# Patient Record
Sex: Male | Born: 1980 | Hispanic: Refuse to answer | Marital: Married | Smoking: Never smoker
Health system: Southern US, Community
[De-identification: ages and names within clinical notes are randomized; demographics above are authoritative.]

---

## 2016-07-20 ENCOUNTER — Other Ambulatory Visit: Payer: Self-pay

## 2016-07-20 ENCOUNTER — Ambulatory Visit (INDEPENDENT_AMBULATORY_CARE_PROVIDER_SITE_OTHER): Payer: BLUE CROSS/BLUE SHIELD

## 2016-07-20 ENCOUNTER — Ambulatory Visit
Admission: EM | Admit: 2016-07-20 | Discharge: 2016-07-20 | Disposition: A | Discharge status: Home / Self Care | Payer: BLUE CROSS/BLUE SHIELD | Attending: Family Medicine | Admitting: Family Medicine

## 2016-07-20 DIAGNOSIS — R509 Fever, unspecified: Secondary | ICD-10-CM | POA: Insufficient documentation

## 2016-07-20 DIAGNOSIS — R079 Chest pain, unspecified: Secondary | ICD-10-CM | POA: Insufficient documentation

## 2016-07-20 DIAGNOSIS — M545 Low back pain: Secondary | ICD-10-CM | POA: Insufficient documentation

## 2016-07-20 DIAGNOSIS — R05 Cough: Secondary | ICD-10-CM | POA: Diagnosis present

## 2016-07-20 DIAGNOSIS — J181 Lobar pneumonia, unspecified organism: Secondary | ICD-10-CM

## 2016-07-20 DIAGNOSIS — J189 Pneumonia, unspecified organism: Secondary | ICD-10-CM | POA: Insufficient documentation

## 2016-07-20 MED ORDER — LEVOFLOXACIN 500 MG PO TABS: 500.0000 mg | ORAL_TABLET | Freq: Every day | ORAL | 0 refills | Status: DC

## 2016-07-20 MED ORDER — CEFTRIAXONE SODIUM 1 G IJ SOLR
1.0000 g | Freq: Once | INTRAMUSCULAR | Status: AC
  Administered 2016-07-20: 1 g via INTRAMUSCULAR

## 2016-07-20 NOTE — ED Triage Notes (Signed)
Pt c/o chest and back pain, he has had a bad cough fo rabout 5 days and it keeps getting worse. He feels weak and its hard to take a deep breath and short of breath.

## 2016-07-20 NOTE — ED Provider Notes (Signed)
[facility]    CSN: [id number] Arrival date & time: 07/20/16  1811     History   Chief Complaint Chief Complaint  Patient presents with  . Cough  . Back Pain    HPI Victor Kim is a 36 y.o. male.   The history is provided by the patient.  Cough  Cough characteristics:  Productive Severity:  Moderate Onset quality:  Sudden Duration:  5 days Chronicity:  New Relieved by:  None tried Associated symptoms: chest pain, fever and shortness of breath   Associated symptoms: no headaches, no myalgias, no sinus congestion, no sore throat and no wheezing   Back Pain  Associated symptoms: chest pain and fever   Associated symptoms: no headaches   URI  Presenting symptoms: congestion, cough, fatigue and fever   Presenting symptoms: no sore throat   Severity:  Moderate Onset quality:  Sudden Duration:  5 days Timing:  Constant Progression:  Worsening Chronicity:  New Relieved by:  None tried Ineffective treatments:  None tried Associated symptoms: no headaches, no myalgias, no sinus pain and no wheezing   Risk factors: sick contacts   Risk factors: not elderly, no chronic cardiac disease, no chronic kidney disease, no chronic respiratory disease, no diabetes mellitus, no immunosuppression, no recent illness and no recent travel     History reviewed. No pertinent past medical history.  There are no active problems to display for this patient.   History reviewed. No pertinent surgical history.     Home Medications    Prior to Admission medications   Medication Sig Start Date End Date Taking? Authorizing Provider  levofloxacin (LEVAQUIN) 500 MG tablet Take 1 tablet (500 mg total) by mouth daily. 07/20/16   Payton Mccallum, MD    Family History History reviewed. No pertinent family history.  Social History Social History  Substance Use Topics  . Smoking status: Never Smoker  . Smokeless tobacco: Never Used  . Alcohol use No     Allergies     Patient has no known allergies.   Review of Systems Review of Systems  Constitutional: Positive for fatigue and fever.  HENT: Positive for congestion. Negative for sinus pain and sore throat.   Respiratory: Positive for cough and shortness of breath. Negative for wheezing.   Cardiovascular: Positive for chest pain.  Musculoskeletal: Positive for back pain. Negative for myalgias.  Neurological: Negative for headaches.     Physical Exam Triage Vital Signs ED Triage Vitals  Enc Vitals Group     BP 07/20/16 1826 129/81     Pulse Rate 07/20/16 1826 85     Resp 07/20/16 1826 18     Temp 07/20/16 1826 98.6 F (37 C)     Temp Source 07/20/16 1826 Oral     SpO2 07/20/16 1826 96 %     Weight 07/20/16 1825 208 lb (94.3 kg)     Height 07/20/16 1825 5\' 9"  (1.753 m)     Head Circumference --      Peak Flow --      Pain Score 07/20/16 1826 8     Pain Loc --      Pain Edu? --      Excl. in GC? --    No data found.   Updated Vital Signs BP 129/81 (BP Location: Left Arm)   Pulse 85   Temp 98.6 F (37 C) (Oral)   Resp 18   Ht 5\' 9"  (1.753 m)   Wt 208 lb (94.3 kg)  SpO2 96%   BMI 30.72 kg/m   Visual Acuity Right Eye Distance:   Left Eye Distance:   Bilateral Distance:    Right Eye Near:   Left Eye Near:    Bilateral Near:     Physical Exam  Constitutional: He appears well-developed and well-nourished. No distress.  HENT:  Head: Normocephalic and atraumatic.  Right Ear: Tympanic membrane, external ear and ear canal normal.  Left Ear: Tympanic membrane, external ear and ear canal normal.  Nose: Nose normal.  Mouth/Throat: Uvula is midline, oropharynx is clear and moist and mucous membranes are normal. No oropharyngeal exudate or tonsillar abscesses.  Eyes: Conjunctivae and EOM are normal. Pupils are equal, round, and reactive to light. Right eye exhibits no discharge. Left eye exhibits no discharge. No scleral icterus.  Neck: Normal range of motion. Neck supple. No  tracheal deviation present. No thyromegaly present.  Cardiovascular: Normal rate, regular rhythm and normal heart sounds.   Pulmonary/Chest: Effort normal. No stridor. No respiratory distress. He has no wheezes. He has rales (left). He exhibits no tenderness.  Lymphadenopathy:    He has no cervical adenopathy.  Neurological: He is alert.  Skin: Skin is warm and dry. No rash noted. He is not diaphoretic.  Nursing note and vitals reviewed.    UC Treatments / Results  Labs (all labs ordered are listed, but only abnormal results are displayed) Labs Reviewed - No data to display  EKG  EKG Interpretation None       Radiology Dg Chest 2 View  Result Date: 07/20/2016 CLINICAL DATA:  Productive cough and chest pain for 3 days EXAM: CHEST  2 VIEW COMPARISON:  None. FINDINGS: Partial consolidation is present in the left lower lobe. Heart size normal. No effusion or pneumothorax. IMPRESSION: Left lower lobe partial consolidation suspicious for pneumonia Electronically Signed   By: Jasmine Pang M.D.   On: 07/20/2016 19:55    Procedures ED EKG Date/Time: 07/20/2016 9:16 PM Performed by: Payton Mccallum Authorized by: Payton Mccallum   ECG reviewed by ED Physician in the absence of a cardiologist: yes   Previous ECG:    Previous ECG:  Unavailable Interpretation:    Interpretation: normal   Rate:    ECG rate assessment: normal   Rhythm:    Rhythm: sinus rhythm   Ectopy:    Ectopy: none   QRS:    QRS axis:  Normal Conduction:    Conduction: normal   ST segments:    ST segments:  Normal T waves:    T waves: normal     (including critical care time)  Medications Ordered in UC Medications  cefTRIAXone (ROCEPHIN) injection 1 g (1 g Intramuscular Given 07/20/16 2008)     Initial Impression / Assessment and Plan / UC Course  I have reviewed the triage vital signs and the nursing notes.  Pertinent labs & imaging results that were available during my care of the patient were  reviewed by me and considered in my medical decision making (see chart for details).  Clinical Course       Final Clinical Impressions(s) / UC Diagnoses   Final diagnoses:  Pneumonia of left lower lobe due to infectious organism [facility])    New Prescriptions Discharge Medication List as of 07/20/2016  8:10 PM    START taking these medications   Details  levofloxacin (LEVAQUIN) 500 MG tablet Take 1 tablet (500 mg total) by mouth daily., Starting Tue 07/20/2016, Normal       1. ekg/x-ray  results and diagnosis reviewed with patient 2. Rocephin 1gm im x 1 3. rx as per orders above; reviewed possible side effects, interactions, risks and benefits  4. Recommend supportive treatment with rest, fluids 5. Follow-up prn if symptoms worsen or don't improve   Payton Mccallumrlando Cheo Selvey, MD 07/20/16 2118

## 2016-07-20 NOTE — ED Notes (Signed)
Patient shows no signs of adverse reaction to medication at this time.  

## 2018-07-30 IMAGING — CR DG CHEST 2V
2 series · 2 of 2 positions shown · non-contrast
Comparison: None.

CLINICAL DATA: Productive cough and chest pain for 3 days

EXAM:
CHEST  2 VIEW

[chest pa]
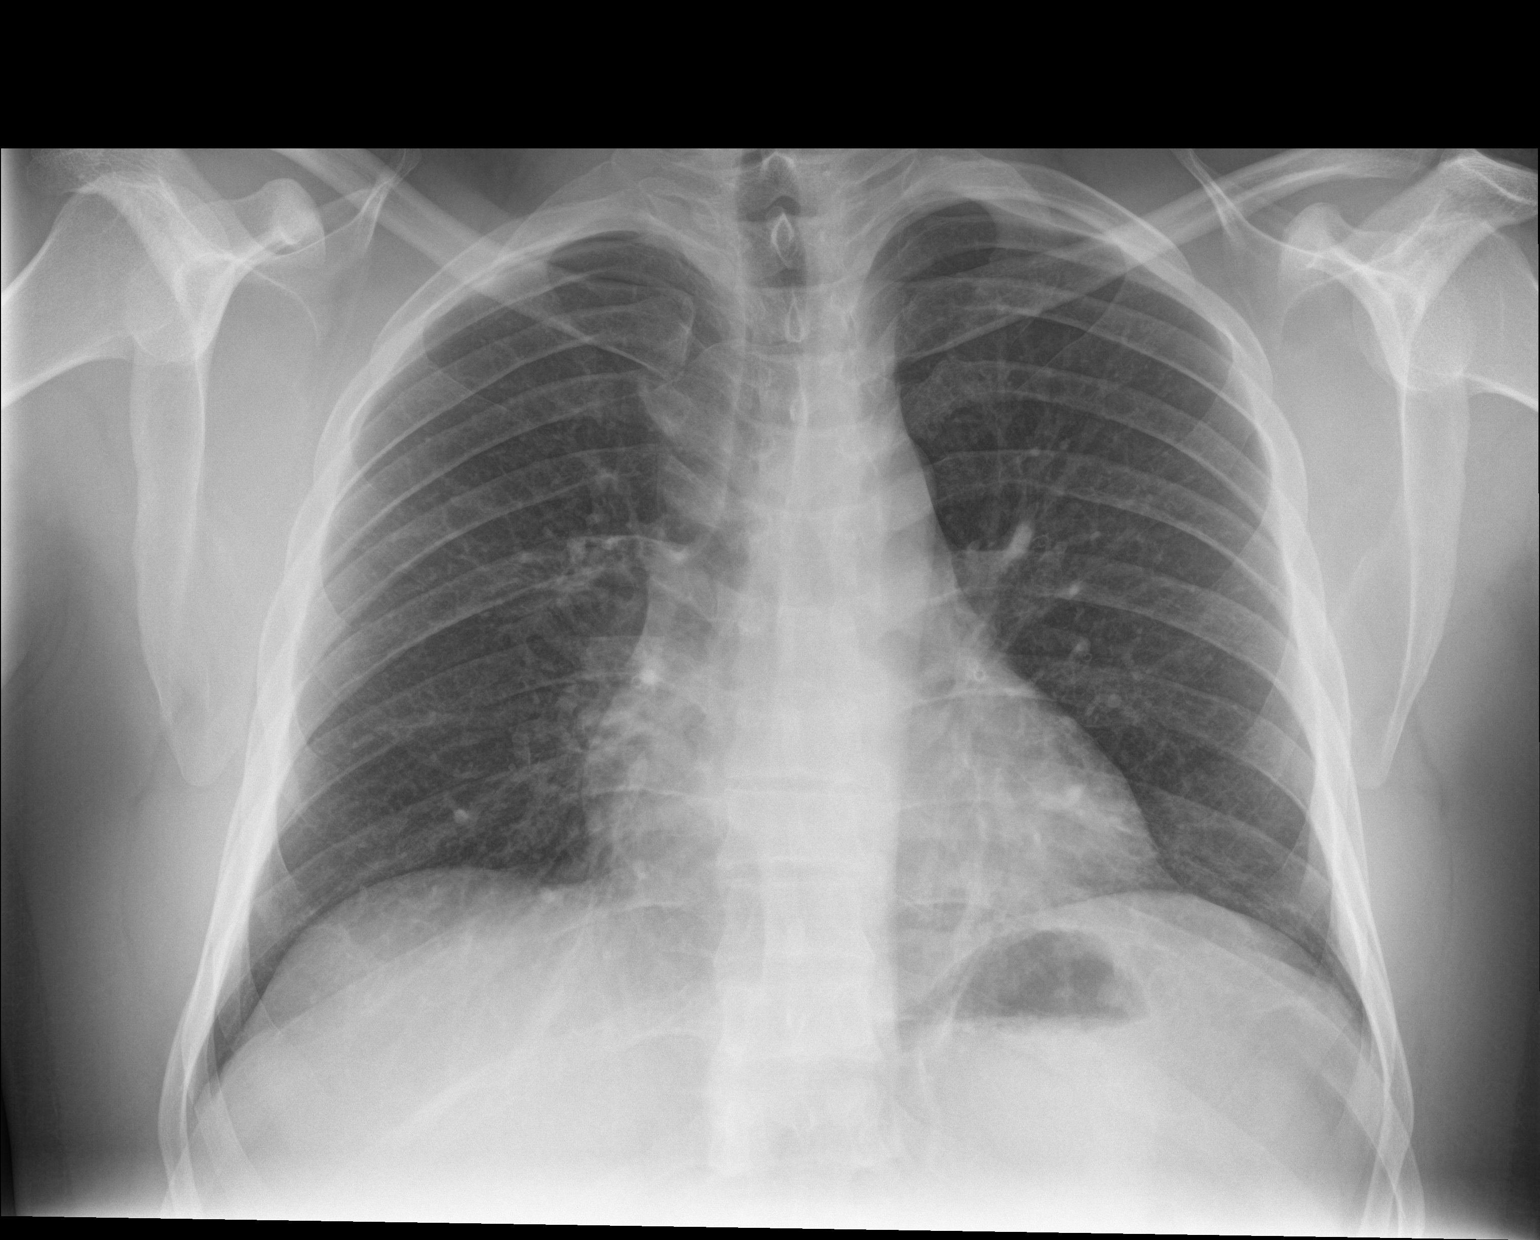

[chest lat]
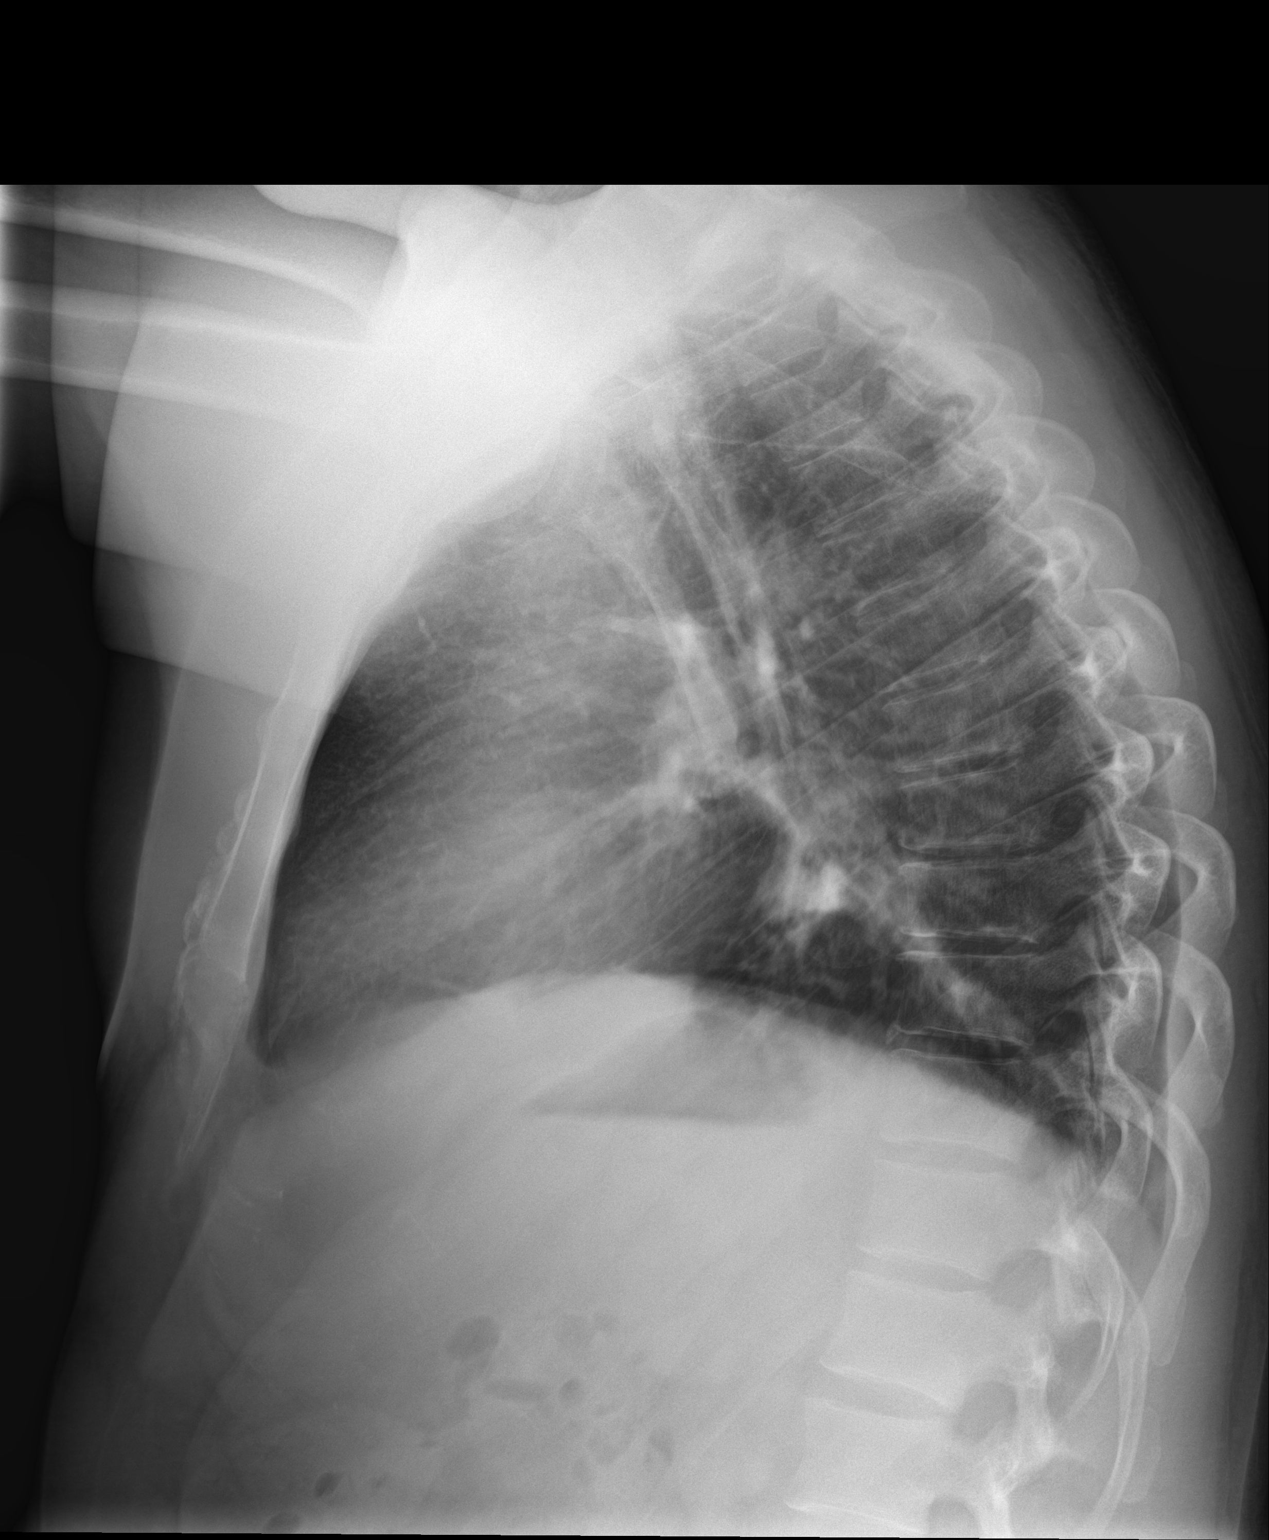

[2 of 2 positions shown; findings below may reference images not displayed]

FINDINGS: Partial consolidation is present in the left lower lobe. Heart size
normal. No effusion or pneumothorax.
IMPRESSION: Left lower lobe partial consolidation suspicious for pneumonia

## 2023-03-14 ENCOUNTER — Ambulatory Visit
Admission: EM | Admit: 2023-03-14 | Discharge: 2023-03-14 | Disposition: A | Discharge status: Home / Self Care | Payer: Self-pay | Attending: Emergency Medicine | Admitting: Emergency Medicine

## 2023-03-14 DIAGNOSIS — L247 Irritant contact dermatitis due to plants, except food: Secondary | ICD-10-CM

## 2023-03-14 MED ORDER — PREDNISONE 10 MG (21) PO TBPK: ORAL_TABLET | Freq: Every day | ORAL | 0 refills | Status: DC

## 2023-03-14 MED ORDER — METHYLPREDNISOLONE ACETATE 80 MG/ML IJ SUSP
80.0000 mg | Freq: Once | INTRAMUSCULAR | Status: AC
  Administered 2023-03-14: 80 mg via INTRAMUSCULAR

## 2023-03-14 NOTE — ED Provider Notes (Signed)
[facility]    CSN: [id number] Arrival date & time: 03/14/23  1550      History   Chief Complaint Chief Complaint  Patient presents with   Poison Ivy    HPI Victor Kim is a 42 y.o. male.   HPI  42 year old male with no significant past medical history presents for evaluation of possible poison ivy rash on both arms and his face.  He reports that he got into some poison ivy 2 days ago.  He denies any pain but does endorse itching.  He does have a lesion on the bridge of his nose near the inner canthus of his left eye but he denies any itching to his eye or discharge.  He also denies any difficulty breathing, throat tightness, or swelling of his lips or tongue.  He has been using calamine lotion without any improvement of symptoms.  History reviewed. No pertinent past medical history.  There are no problems to display for this patient.   History reviewed. No pertinent surgical history.     Home Medications    Prior to Admission medications   Medication Sig Start Date End Date Taking? Authorizing Provider  predniSONE (STERAPRED UNI-PAK 21 TAB) 10 MG (21) TBPK tablet Take by mouth daily. Take 6 tabs by mouth daily  for 2 days, then 5 tabs for 2 days, then 4 tabs for 2 days, then 3 tabs for 2 days, 2 tabs for 2 days, then 1 tab by mouth daily for 2 days 03/14/23  Yes Becky Augusta, NP  levofloxacin (LEVAQUIN) 500 MG tablet Take 1 tablet (500 mg total) by mouth daily. 07/20/16   Payton Mccallum, MD    Family History History reviewed. No pertinent family history.  Social History Social History   Tobacco Use   Smoking status: Never   Smokeless tobacco: Never  Substance Use Topics   Alcohol use: No   Drug use: No     Allergies   Patient has no known allergies.   Review of Systems Review of Systems  HENT:  Negative for facial swelling and trouble swallowing.   Eyes:  Negative for discharge, itching and visual disturbance.  Respiratory:   Negative for shortness of breath and stridor.   Skin:  Positive for rash.     Physical Exam Triage Vital Signs ED Triage Vitals [03/14/23 1559]  Encounter Vitals Group     BP      Systolic BP Percentile      Diastolic BP Percentile      Pulse      Resp      Temp      Temp src      SpO2      Weight 210 lb (95.3 kg)     Height      Head Circumference      Peak Flow      Pain Score 0     Pain Loc      Pain Education      Exclude from Growth Chart    No data found.  Updated Vital Signs BP (!) 144/69 (BP Location: Right Arm)   Pulse 67   Temp 98.4 F (36.9 C) (Oral)   Resp 17   Wt 210 lb (95.3 kg)   SpO2 96%   BMI 31.01 kg/m   Visual Acuity Right Eye Distance:   Left Eye Distance:   Bilateral Distance:    Right Eye Near:   Left Eye Near:    Bilateral  Near:     Physical Exam Vitals and nursing note reviewed.  Constitutional:      Appearance: Normal appearance. He is not ill-appearing.  HENT:     Head: Normocephalic and atraumatic.  Skin:    General: Skin is warm and dry.     Capillary Refill: Capillary refill takes less than 2 seconds.     Findings: Rash present.  Neurological:     General: No focal deficit present.     Mental Status: He is alert and oriented to person, place, and time.      UC Treatments / Results  Labs (all labs ordered are listed, but only abnormal results are displayed) Labs Reviewed - No data to display  EKG   Radiology No results found.  Procedures Procedures (including critical care time)  Medications Ordered in UC Medications  methylPREDNISolone acetate (DEPO-MEDROL) injection 80 mg (has no administration in time range)    Initial Impression / Assessment and Plan / UC Course  I have reviewed the triage vital signs and the nursing notes.  Pertinent labs & imaging results that were available during my care of the patient were reviewed by me and considered in my medical decision making (see chart for details).    Patient is a pleasant, nontoxic-appearing 42 year old male presenting for evaluation of rash on both arms and face as result of poison ivy exposure as outlined HPI above.  He is not having any swelling to his face, tightness in throat, shortness of breath.    I will discharge patient with diagnosis of contact dermatitis secondary to plant exposure after having staff administer 80 mg of IM Depo-Medrol here in clinic.  I will start him on a 12-day prednisone taper at home.  He can continue to use the calamine lotion to help with itching.  I did discuss that if he starts develop itching in his eye, changes to his vision, or discharge from his eye that he needs to follow-up with an eye doctor.  Patient verbalized understanding of same.   Final Clinical Impressions(s) / UC Diagnoses   Final diagnoses:  Irritant contact dermatitis due to plants, except food     Discharge Instructions      Tome la prednisona segn las instrucciones del envase.  Use Allegra, Claritin o Zyrtec de venta libre Administrator segn sea necesario para la picazn y use Benadryl 50 mg antes de acostarse. Esto tambin puede ayudarlo a dormir, ya que los esteroides pueden interrumpir su ciclo de sueo.  Aplique locin de calamina en el sarpullido en las extremidades para ayudar a secarlo. No use locin de calamina en la cara.  En el caso de las lesiones faciales, si presenta algn cambio en su visin o picazn e irritacin en los ojos, vaya a urgencias para una evaluacin o seguimiento con oftalmologa.  Take the prednisone according to the package instructions.  Use over-the-counter Allegra, Claritin, or Zyrtec during the day as needed for itching and use Benadryl 50 mg at bedtime.  This may also help you sleep as a steroids may interrupt your sleep cycle.  Apply calamine lotion to the rash on your extremities to help dry it up.  Do not use calamine lotion on your face.  For facial lesions, if you develop any changes  in your vision or itching and irritation in your eyes please go to the ER for evaluation or follow-up with ophthalmology.      ED Prescriptions     Medication Sig Dispense Auth. Provider  predniSONE (STERAPRED UNI-PAK 21 TAB) 10 MG (21) TBPK tablet Take by mouth daily. Take 6 tabs by mouth daily  for 2 days, then 5 tabs for 2 days, then 4 tabs for 2 days, then 3 tabs for 2 days, 2 tabs for 2 days, then 1 tab by mouth daily for 2 days 42 tablet Becky Augusta, NP      PDMP not reviewed this encounter.   Becky Augusta, NP 03/14/23 1610

## 2023-03-14 NOTE — ED Triage Notes (Signed)
Patient got into poison ivy on Friday. No pain just itching.

## 2023-03-14 NOTE — Discharge Instructions (Addendum)
Tome la prednisona segn las instrucciones del envase.  Use Allegra, Claritin o Zyrtec de venta libre Administrator segn sea necesario para la picazn y use Benadryl 50 mg antes de acostarse. Esto tambin puede ayudarlo a dormir, ya que los esteroides pueden interrumpir su ciclo de sueo.  Aplique locin de calamina en el sarpullido en las extremidades para ayudar a secarlo. No use locin de calamina en la cara.  En el caso de las lesiones faciales, si presenta algn cambio en su visin o picazn e irritacin en los ojos, vaya a urgencias para una evaluacin o seguimiento con oftalmologa.  Take the prednisone according to the package instructions.  Use over-the-counter Allegra, Claritin, or Zyrtec during the day as needed for itching and use Benadryl 50 mg at bedtime.  This may also help you sleep as a steroids may interrupt your sleep cycle.  Apply calamine lotion to the rash on your extremities to help dry it up.  Do not use calamine lotion on your face.  For facial lesions, if you develop any changes in your vision or itching and irritation in your eyes please go to the ER for evaluation or follow-up with ophthalmology.

## 2023-06-11 ENCOUNTER — Ambulatory Visit
Admission: EM | Admit: 2023-06-11 | Discharge: 2023-06-11 | Disposition: A | Discharge status: Home / Self Care | Payer: Self-pay | Attending: Emergency Medicine | Admitting: Emergency Medicine

## 2023-06-11 DIAGNOSIS — B356 Tinea cruris: Secondary | ICD-10-CM

## 2023-06-11 MED ORDER — CLOTRIMAZOLE-BETAMETHASONE 1-0.05 % EX CREA: TOPICAL_CREAM | CUTANEOUS | 0 refills | Status: DC

## 2023-06-11 NOTE — ED Provider Notes (Signed)
[facility]    CSN: [id number] Arrival date & time: 06/11/23  1410      History   Chief Complaint Chief Complaint  Patient presents with   Rash    Rash on groin x1 day    HPI Carter Auth Acari Lydecker is a 42 y.o. male.   HPI  42 year old male with no significant past medical history presents for evaluation of groin rash for the last month.  He states that the area is dry and that it itches.  He is unsure if it is red or not because its back behind his scrotum where he cannot see.  History reviewed. No pertinent past medical history.  There are no problems to display for this patient.   History reviewed. No pertinent surgical history.     Home Medications    Prior to Admission medications   Medication Sig Start Date End Date Taking? Authorizing Provider  clotrimazole-betamethasone (LOTRISONE) cream Apply to affected area 2 times daily prn 06/11/23  Yes Becky Augusta, NP  levofloxacin (LEVAQUIN) 500 MG tablet Take 1 tablet (500 mg total) by mouth daily. 07/20/16   Payton Mccallum, MD  predniSONE (STERAPRED UNI-PAK 21 TAB) 10 MG (21) TBPK tablet Take by mouth daily. Take 6 tabs by mouth daily  for 2 days, then 5 tabs for 2 days, then 4 tabs for 2 days, then 3 tabs for 2 days, 2 tabs for 2 days, then 1 tab by mouth daily for 2 days 03/14/23   Becky Augusta, NP    Family History History reviewed. No pertinent family history.  Social History Social History   Tobacco Use   Smoking status: Never   Smokeless tobacco: Never  Vaping Use   Vaping status: Never Used  Substance Use Topics   Alcohol use: No   Drug use: No     Allergies   Patient has no known allergies.   Review of Systems Review of Systems  Skin:  Positive for rash.     Physical Exam Triage Vital Signs ED Triage Vitals  Encounter Vitals Group     BP      Systolic BP Percentile      Diastolic BP Percentile      Pulse      Resp      Temp      Temp src      SpO2      Weight       Height      Head Circumference      Peak Flow      Pain Score      Pain Loc      Pain Education      Exclude from Growth Chart    No data found.  Updated Vital Signs BP 128/73 (BP Location: Left Arm)   Pulse 63   Temp 98 F (36.7 C) (Oral)   Resp 17   Ht 5\' 5"  (1.651 m)   Wt 205 lb (93 kg)   SpO2 97%   BMI 34.11 kg/m   Visual Acuity Right Eye Distance:   Left Eye Distance:   Bilateral Distance:    Right Eye Near:   Left Eye Near:    Bilateral Near:     Physical Exam Vitals and nursing note reviewed.  Constitutional:      Appearance: Normal appearance. He is not ill-appearing.  HENT:     Head: Normocephalic and atraumatic.  Skin:    General: Skin is warm and dry.  Capillary Refill: Capillary refill takes less than 2 seconds.     Findings: Rash present.  Neurological:     General: No focal deficit present.     Mental Status: He is alert and oriented to person, place, and time.      UC Treatments / Results  Labs (all labs ordered are listed, but only abnormal results are displayed) Labs Reviewed - No data to display  EKG   Radiology No results found.  Procedures Procedures (including critical care time)  Medications Ordered in UC Medications - No data to display  Initial Impression / Assessment and Plan / UC Course  I have reviewed the triage vital signs and the nursing notes.  Pertinent labs & imaging results that were available during my care of the patient were reviewed by me and considered in my medical decision making (see chart for details).   Patient is a pleasant, nontoxic-appearing 42 year old male presenting for evaluation of perineal rash x 1 month.  On exam he has a hyperpigmented area on the superior aspect of both inner thighs as well as on his perineum behind his scrotum that is consistent with tinea cruris.  I will discharge him home on Lotrisone cream and have him apply it twice daily until the rash is resolved and then for 3  additional days.  We also discussed wearing loosefitting clothing to allow air to get to the area and breathe.  He should also dry the area with a hair dryer following a shower to make sure that all moisture is resolved and prevent worsening of his symptoms.   Final Clinical Impressions(s) / UC Diagnoses   Final diagnoses:  Jock itch     Discharge Instructions      Aplique la crema Lotrisone en la zona afectada dos veces al da hasta que desaparezca la erupcin y la picazn y luego durante 3 das ms.  Lave la zona con agua tibia y jabn y squela con palmaditas.  Tambin debe asegurarse de secar bien la zona con un secador de manos antes de vestirse.  Use ropa holgada, preferiblemente de algodn, para permitir que el aire circule por la zona y ayudar a prevenir el empeoramiento de la infeccin por hongos.  Evite rascarse la zona afectada tanto como sea posible. Si hace estiramientos, asegrese de lavarse las manos con jabn antibacteriano de inmediato.  Vuelva para una reevaluacin si presenta sntomas nuevos o persistentes.  Apply the Lotrisone cream to the affected area twice daily until the rash and itching has resolved and then for 3 additional days.  Wash the area with warm water and soap and pat it dry.  You should also make sure the area is dried thoroughly with a hand dryer before getting dressed.  Wear loosefitting clothing, preferably cotton, to allow airflow to the area to help prevent worsening of the yeast infection.  Avoid scratching the itch is much as possible.  If you do stretches make sure that you wash your hands with antibacterial soap right away.  Return for reevaluation for any new or continued symptoms.     ED Prescriptions     Medication Sig Dispense Auth. Provider   clotrimazole-betamethasone (LOTRISONE) cream Apply to affected area 2 times daily prn 45 g Becky Augusta, NP      PDMP not reviewed this encounter.   Becky Augusta, NP 06/11/23  [phone]

## 2023-06-11 NOTE — Discharge Instructions (Addendum)
Aplique la crema Lotrisone en la zona afectada dos veces al da hasta que desaparezca la erupcin y la picazn y luego durante 3 das ms.  Lave la zona con agua tibia y jabn y squela con palmaditas.  Tambin debe asegurarse de secar bien la zona con un secador de manos antes de vestirse.  Use ropa holgada, preferiblemente de algodn, para permitir que el aire circule por la zona y ayudar a prevenir el empeoramiento de la infeccin por hongos.  Evite rascarse la zona afectada tanto como sea posible. Si hace estiramientos, asegrese de lavarse las manos con jabn antibacteriano de inmediato.  Vuelva para una reevaluacin si presenta sntomas nuevos o persistentes.  Apply the Lotrisone cream to the affected area twice daily until the rash and itching has resolved and then for 3 additional days.  Wash the area with warm water and soap and pat it dry.  You should also make sure the area is dried thoroughly with a hand dryer before getting dressed.  Wear loosefitting clothing, preferably cotton, to allow airflow to the area to help prevent worsening of the yeast infection.  Avoid scratching the itch is much as possible.  If you do stretches make sure that you wash your hands with antibacterial soap right away.  Return for reevaluation for any new or continued symptoms.

## 2023-06-11 NOTE — ED Triage Notes (Signed)
Pt states that he has a rash and some itching of his groin. X1 day

## 2024-06-12 ENCOUNTER — Ambulatory Visit
Admission: EM | Admit: 2024-06-12 | Discharge: 2024-06-12 | Disposition: A | Discharge status: Home / Self Care | Payer: Self-pay | Attending: Physician Assistant | Admitting: Physician Assistant

## 2024-06-12 DIAGNOSIS — M79601 Pain in right arm: Secondary | ICD-10-CM

## 2024-06-12 DIAGNOSIS — M79602 Pain in left arm: Secondary | ICD-10-CM

## 2024-06-12 DIAGNOSIS — R252 Cramp and spasm: Secondary | ICD-10-CM

## 2024-06-12 MED ORDER — NAPROXEN 500 MG PO TABS: 500.0000 mg | ORAL_TABLET | Freq: Two times a day (BID) | ORAL | 0 refills | Status: AC

## 2024-06-12 MED ORDER — TIZANIDINE HCL 4 MG PO TABS: 4.0000 mg | ORAL_TABLET | Freq: Every evening | ORAL | 0 refills | Status: AC | PRN

## 2024-06-12 NOTE — ED Triage Notes (Addendum)
 Patient states that he's been having cramps in both arms x 1 month. Patient states that he only has pain at night  Patient refused interpreter. Daughter is in the room.

## 2024-06-12 NOTE — Discharge Instructions (Addendum)
-   The anti-inflammatory medication as well as the muscle relaxer at bedtime. - If not improving over the next 1 to 2 weeks or symptoms are worsening please follow-up with primary care provider.  You should also follow-up with them anyway for a general physical and basic lab work.

## 2024-06-12 NOTE — ED Provider Notes (Signed)
 [facility]    CSN: [id number] Arrival date & time: 06/12/24  1058      History   Chief Complaint Chief Complaint  Patient presents with   Arm Pain    HPI Victor Kim is a 43 y.o. male presenting with his daughter for 1 month history of nocturnal arm cramps. Reports pain in elbow, forearms, and hands. He says his arms do not really hurt during the day. 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 He denies injuries. No numbness, tingling or weakness. Has not taken any OTC meds.  Patient's daughter is helping to interpret since he declines an interpreter.  HPI  History reviewed. No pertinent past medical history.  There are no active problems to display for this patient.   History reviewed. No pertinent surgical history.     Home Medications    Prior to Admission medications   Medication Sig Start Date End Date Taking? Authorizing Provider  naproxen (NAPROSYN) 500 MG tablet Take 1 tablet (500 mg total) by mouth 2 (two) times daily. 06/12/24  Yes Arvis Huxley B, PA-C  tiZANidine (ZANAFLEX) 4 MG tablet Take 1 tablet (4 mg total) by mouth at bedtime as needed for up to 15 days for muscle spasms. 06/12/24 06/27/24 Yes Arvis Huxley NOVAK PA-C    Family History History reviewed. No pertinent family history.  Social History Social History   Tobacco Use   Smoking status: Never   Smokeless tobacco: Never  Vaping Use   Vaping status: Never Used  Substance Use Topics   Alcohol use: No   Drug use: No     Allergies   Patient has no known allergies.   Review of Systems Review of Systems  Musculoskeletal:  Positive for arthralgias and myalgias. Negative for back pain, gait problem, joint swelling, neck pain and neck stiffness.  Skin:  Negative for color change, rash and wound.  Neurological:  Negative for weakness and numbness.     Physical Exam Triage Vital Signs ED Triage Vitals [06/12/24 1132]  Encounter Vitals Group     BP      Girls  Systolic BP Percentile      Girls Diastolic BP Percentile      Boys Systolic BP Percentile      Boys Diastolic BP Percentile      Pulse      Resp      Temp      Temp src      SpO2      Weight 210 lb (95.3 kg)     Height      Head Circumference      Peak Flow      Pain Score 0     Pain Loc      Pain Education      Exclude from Growth Chart    No data found.  Updated Vital Signs BP 124/73 (BP Location: Right Arm)   Pulse (!) 57   Temp 98.9 F (37.2 C) (Oral)   Resp 18   Wt 210 lb (95.3 kg)   SpO2 97%   BMI 34.95 kg/m      Physical Exam Vitals and nursing note reviewed.  Constitutional:      General: He is not in acute distress.    Appearance: Normal appearance. He is well-developed. He is not ill-appearing.  HENT:     Head: Normocephalic and atraumatic.  Eyes:     General: No scleral icterus.    Conjunctiva/sclera: Conjunctivae normal.  Cardiovascular:  Rate and Rhythm: Normal rate and regular rhythm.  Pulmonary:     Effort: Pulmonary effort is normal. No respiratory distress.     Breath sounds: Normal breath sounds.  Musculoskeletal:     Cervical back: Neck supple.     Comments: No swelling, rashes or wounds of arm. Full ROM of all joints of upper extremities. Mild generalized TTP to forearms.  Skin:    General: Skin is warm and dry.     Capillary Refill: Capillary refill takes less than 2 seconds.  Neurological:     General: No focal deficit present.     Mental Status: He is alert. Mental status is at baseline.     Motor: No weakness.     Gait: Gait normal.  Psychiatric:        Mood and Affect: Mood normal.        Behavior: Behavior normal.      UC Treatments / Results  Labs (all labs ordered are listed, but only abnormal results are displayed) Labs Reviewed - No data to display  EKG   Radiology No results found.  Procedures Procedures (including critical care time)  Medications Ordered in UC Medications - No data to  display  Initial Impression / Assessment and Plan / UC Course  I have reviewed the triage vital signs and the nursing notes.  Pertinent labs & imaging results that were available during my care of the patient were reviewed by me and considered in my medical decision making (see chart for details).   43 y/o male presents for nocturnal cramping in bilateral arms. No injuries. No weakness or numbness. He does work garment/textile technologist.  On evaluation he has mild generalized tenderness of bilateral forearms, otherwise normal.  Muscle cramps and possible strain related to construction and weight training.  Sent naproxen to pharmacy.  Tizanidine as needed at night for cramps.  Advised following up with primary care provider especially symptoms or not improving over the next 1 to 2 weeks.   Final Clinical Impressions(s) / UC Diagnoses   Final diagnoses:  Bilateral arm pain  Muscle cramps     Discharge Instructions      - The anti-inflammatory medication as well as the muscle relaxer at bedtime. - If not improving over the next 1 to 2 weeks or symptoms are worsening please follow-up with primary care provider.  You should also follow-up with them anyway for a general physical and basic lab work.     ED Prescriptions     Medication Sig Dispense Auth. Provider   naproxen (NAPROSYN) 500 MG tablet Take 1 tablet (500 mg total) by mouth 2 (two) times daily. 30 tablet Arvis Huxley B, PA-C   tiZANidine (ZANAFLEX) 4 MG tablet Take 1 tablet (4 mg total) by mouth at bedtime as needed for up to 15 days for muscle spasms. 15 tablet Jamez Ambrocio B, PA-C      PDMP not reviewed this encounter.   Arvis Huxley NOVAK, PA-C 06/12/24 1229
